# Patient Record
Sex: Male | Born: 2000 | Race: Black or African American | Hispanic: No | Marital: Single | Smoking: Never smoker
Health system: Southern US, Community
[De-identification: ages and names within clinical notes are randomized; demographics above are authoritative.]

## PROBLEM LIST (undated history)

## (undated) DIAGNOSIS — J45909 Unspecified asthma, uncomplicated: Secondary | ICD-10-CM

---
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
## 2019-06-05 ENCOUNTER — Other Ambulatory Visit: Payer: Self-pay
